# Patient Record
Sex: Female | Born: 2005 | Hispanic: Yes | Marital: Single
Health system: Southern US, Community
[De-identification: ages and names within clinical notes are randomized; demographics above are authoritative.]

## PROBLEM LIST (undated history)

## (undated) ENCOUNTER — Emergency Department (HOSPITAL_BASED_OUTPATIENT_CLINIC_OR_DEPARTMENT_OTHER): Discharge status: Absent without leave | Payer: No Typology Code available for payment source

---
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
## 2023-12-15 ENCOUNTER — Other Ambulatory Visit: Payer: Self-pay | Admitting: Internal Medicine

## 2023-12-15 DIAGNOSIS — N6310 Unspecified lump in the right breast, unspecified quadrant: Secondary | ICD-10-CM

## 2024-01-13 NOTE — Progress Notes (Unsigned)
 ANNUAL EXAM Patient name: Heidi Lin MRN [id number]  Date of birth: 2006-03-25 Chief Complaint:   No chief complaint on file.  History of Present Illness:   Heidi Lin is a 18 y.o. No obstetric history on file. {race:25618} female being seen today for a routine annual exam.   Current complaints: ***  No LMP recorded.   The pregnancy intention screening data noted above was reviewed. Potential methods of contraception were discussed. The patient elected to proceed with No data recorded.   Last pap not yet indicated due to age.  Last mammogram: Not yet indicated due to age. Results were: {normal, abnormal, n/a:23837}. Family h/o breast cancer: {yes***/no:23838} Last colonoscopy: Not yet indicated due to age. Results were: {normal, abnormal, n/a:23837}. Family h/o colorectal cancer: {yes***/no:23838} STI screening: Contraception:      No data to display               No data to display           Review of Systems:   Pertinent items are noted in HPI Denies any headaches, blurred vision, fatigue, shortness of breath, chest pain, abdominal pain, abnormal vaginal discharge/itching/odor/irritation, problems with periods, bowel movements, urination, or intercourse unless otherwise stated above. Pertinent History Reviewed:  Reviewed past medical,surgical, social and family history.  Reviewed problem list, medications and allergies. Physical Assessment:  There were no vitals filed for this visit.There is no height or weight on file to calculate BMI.        Physical Examination:   General appearance - well appearing, and in no distress  Mental status - alert, oriented to person, place, and time  Psych:  She has a normal mood and affect  Skin - warm and dry, normal color, no suspicious lesions noted  Chest - effort normal, all lung fields clear to auscultation bilaterally  Heart - normal rate and regular rhythm  Neck:  midline trachea, no thyromegaly  or nodules  Breasts - breasts appear normal, no suspicious masses, no skin or nipple changes or  axillary nodes  Abdomen - soft, nontender, nondistended, no masses or organomegaly  Pelvic - VULVA: normal appearing vulva with no masses, tenderness or lesions  VAGINA: normal appearing vagina with normal color and discharge, no lesions  CERVIX: normal appearing cervix without discharge or lesions, no CMT  UTERUS: uterus is felt to be normal size, shape, consistency and nontender   ADNEXA: No adnexal masses or tenderness noted.  Extremities:  No swelling or varicosities noted  Chaperone present for exam  No results found for this or any previous visit (from the past 24 hours).  Assessment & Plan:  - Cervical cancer screening: Discussed purpose of pap and recommended first pap at age 59. Discussed screening Q3 years. Reviewed importance of annual exams and limits of pap smear. - GC/CT: Discussed and recommended. Pt  {Blank single:19197::"accepts","declines"} - Gardasil:  {Blank single:19197::"***","has not yet had. Will provide information","completed","has not yet had. Counseling provided and she declines","Has not yet had. Counseling provided and pt accepts"} - Birth Control: {Birth control type:23956} - Breast Health: Encouraged self breast awareness/exams - Mammogram: {Mammo f/u:25212::"@ 20yo"}, or sooner if problems - Colonoscopy: {TCS f/u:25213::"@ 20yo"}, or sooner if problems - Follow-up: 12 months and prn     No orders of the defined types were placed in this encounter.   Meds: No orders of the defined types were placed in this encounter.   Follow-up: No follow-ups on file.  Ralene Muskrat, [place] 01/13/2024 5:29 PM

## 2024-01-14 ENCOUNTER — Ambulatory Visit: Payer: Medicaid Other | Admitting: Physician Assistant

## 2024-01-14 ENCOUNTER — Other Ambulatory Visit (HOSPITAL_COMMUNITY)
Admission: RE | Admit: 2024-01-14 | Discharge: 2024-01-14 | Disposition: A | Discharge status: Home / Self Care | Source: Ambulatory Visit | Attending: Physician Assistant | Admitting: Physician Assistant

## 2024-01-14 ENCOUNTER — Encounter: Payer: Self-pay | Admitting: Physician Assistant

## 2024-01-14 VITALS — BP 104/79 | HR 67 | Ht 61.0 in | Wt 137.4 lb

## 2024-01-14 DIAGNOSIS — L309 Dermatitis, unspecified: Secondary | ICD-10-CM | POA: Diagnosis present

## 2024-01-14 MED ORDER — TRIAMCINOLONE ACETONIDE 0.1 % EX CREA: 1.0000 | TOPICAL_CREAM | Freq: Every day | CUTANEOUS | 0 refills | Status: AC

## 2024-01-14 MED ORDER — CETIRIZINE HCL 5 MG PO TABS: 5.0000 mg | ORAL_TABLET | Freq: Every day | ORAL | 1 refills | Status: DC

## 2024-01-14 NOTE — Patient Instructions (Addendum)
 Vulvar Dermatitis is a condition that causes inflammation of the skin around the vulva, leading to symptoms such as dryness, itching, burning, redness, and swelling.   Causes of vulvar dermatitis are:  - Irritants: soaps, detergents, and hygiene products can irritate the sensitive skin of the vulva.  - Allergens: Fragrances, preservatives, and other chemicals in personal care products.  - Infections: Bacterial or fungal infections can exacerbate symptoms.  - Chronic skin conditions: Conditions like eczema or psoriasis.   Today, I'm giving you a topical steroid to be applied twice daily for 2-4 weeks, or less if symptoms resolve. You should also try to avoid anything that triggers your symptoms. Use gentle, fragrance-free products, wear cotton underwear, and avoid tight clothing. Use mild cleansers and avoid aggressive washing or using wash cloths/loofas.   Please return if your symptoms persist despite treatment, you experience severe pain or discomfort, or if you notice any unusual changes in the vulvar area.

## 2024-01-14 NOTE — Progress Notes (Signed)
 Pt presents for vulvar dermatitis on left side. Pt states that she has been using grape seed oil to help with the dryness, but it is spreading.

## 2024-01-15 LAB — CERVICOVAGINAL ANCILLARY ONLY
Bacterial Vaginitis (gardnerella): NEGATIVE
Candida Glabrata: NEGATIVE
Candida Vaginitis: NEGATIVE
Chlamydia: NEGATIVE
Comment: NEGATIVE
Comment: NEGATIVE
Comment: NEGATIVE
Comment: NEGATIVE
Comment: NEGATIVE
Comment: NORMAL
Neisseria Gonorrhea: NEGATIVE
Trichomonas: NEGATIVE

## 2024-01-16 ENCOUNTER — Encounter: Payer: Self-pay | Admitting: Physician Assistant

## 2024-02-02 ENCOUNTER — Ambulatory Visit
Admission: RE | Admit: 2024-02-02 | Discharge: 2024-02-02 | Disposition: A | Discharge status: Home / Self Care | Source: Ambulatory Visit | Attending: Internal Medicine | Admitting: Internal Medicine

## 2024-02-02 ENCOUNTER — Other Ambulatory Visit: Payer: Self-pay | Admitting: Internal Medicine

## 2024-02-02 DIAGNOSIS — N6310 Unspecified lump in the right breast, unspecified quadrant: Secondary | ICD-10-CM

## 2024-02-11 ENCOUNTER — Ambulatory Visit: Admitting: Obstetrics and Gynecology

## 2024-08-04 ENCOUNTER — Other Ambulatory Visit

## 2024-11-10 ENCOUNTER — Other Ambulatory Visit (HOSPITAL_COMMUNITY)
Admission: RE | Admit: 2024-11-10 | Discharge: 2024-11-10 | Disposition: A | Discharge status: Home / Self Care | Source: Ambulatory Visit | Attending: Obstetrics & Gynecology | Admitting: Obstetrics & Gynecology

## 2024-11-10 ENCOUNTER — Ambulatory Visit: Payer: Self-pay

## 2024-11-10 VITALS — BP 115/75 | HR 77 | Ht 61.0 in | Wt 141.3 lb

## 2024-11-10 DIAGNOSIS — N898 Other specified noninflammatory disorders of vagina: Secondary | ICD-10-CM | POA: Diagnosis not present

## 2024-11-10 NOTE — Progress Notes (Signed)
 SUBJECTIVE:  19 y.o. female complains of white, yellow/green, and thick vaginal discharge and itching for 1-2 week(s). Denies abnormal vaginal bleeding or significant pelvic pain or fever. No UTI symptoms. Denies history of known exposure to STD.  LMP: 10/16/24  OBJECTIVE:  She appears well, afebrile. Urine dipstick: not done.  ASSESSMENT:  Vaginal Discharge  Vaginal Odor   PLAN:  GC, chlamydia, trichomonas, BVAG, CVAG probe sent to lab. Treatment: To be determined once lab results are received ROV prn if symptoms persist or worsen.

## 2024-11-11 LAB — CERVICOVAGINAL ANCILLARY ONLY
Bacterial Vaginitis (gardnerella): POSITIVE — AB
Candida Glabrata: NEGATIVE
Candida Vaginitis: POSITIVE — AB
Chlamydia: NEGATIVE
Comment: NEGATIVE
Comment: NEGATIVE
Comment: NEGATIVE
Comment: NEGATIVE
Comment: NEGATIVE
Comment: NORMAL
Neisseria Gonorrhea: NEGATIVE
Trichomonas: NEGATIVE

## 2024-11-12 ENCOUNTER — Ambulatory Visit: Payer: Self-pay | Admitting: Obstetrics & Gynecology

## 2024-11-12 DIAGNOSIS — N898 Other specified noninflammatory disorders of vagina: Secondary | ICD-10-CM

## 2024-11-12 MED ORDER — FLUCONAZOLE 150 MG PO TABS: 150.0000 mg | ORAL_TABLET | Freq: Once | ORAL | 0 refills | Status: AC

## 2024-11-12 MED ORDER — METRONIDAZOLE 500 MG PO TABS: 500.0000 mg | ORAL_TABLET | Freq: Two times a day (BID) | ORAL | 0 refills | Status: AC
